# Patient Record
Sex: Male | Born: 1995 | Race: Black or African American | Hispanic: No | Marital: Single | Smoking: Current some day smoker
Health system: Southern US, Community
[De-identification: ages and names within clinical notes are randomized; demographics above are authoritative.]

---
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
## 2017-02-04 ENCOUNTER — Encounter (HOSPITAL_COMMUNITY): Payer: Self-pay

## 2017-02-04 ENCOUNTER — Emergency Department (HOSPITAL_COMMUNITY)
Admission: EM | Admit: 2017-02-04 | Discharge: 2017-02-04 | Disposition: A | Discharge status: Home / Self Care | Payer: Self-pay | Attending: Emergency Medicine | Admitting: Emergency Medicine

## 2017-02-04 DIAGNOSIS — R21 Rash and other nonspecific skin eruption: Secondary | ICD-10-CM | POA: Insufficient documentation

## 2017-02-04 DIAGNOSIS — F17200 Nicotine dependence, unspecified, uncomplicated: Secondary | ICD-10-CM | POA: Insufficient documentation

## 2017-02-04 MED ORDER — TRIAMCINOLONE ACETONIDE 0.1 % EX CREA: 1.0000 "application " | TOPICAL_CREAM | Freq: Two times a day (BID) | CUTANEOUS | 0 refills | Status: DC

## 2017-02-04 NOTE — Discharge Instructions (Signed)
Use Lamisil for one week - use as directed If rash has not gotten better after one week, fill steroid prescription and use twice daily for a week If rash is still not better after you have tried these two different treatments, make appointment with a primary care doctor or dermatologist Take Benadryl 25mg  as needed for itching which is over the counter

## 2017-02-04 NOTE — ED Provider Notes (Signed)
[facility]EMERGENCY DEPT Provider Note   CSN: [id number] Arrival date & time: 02/04/17  [id number]  By signing my name below, I, Tony Brooks, attest that this documentation has been prepared under the direction and in the presence of Terance HartKelly Oree Hislop, PA-C . Electronically Signed: Majel HomerPeyton Brooks, Scribe. 02/04/2017. 9:59 AM.  History   Chief Complaint Chief Complaint  Patient presents with  . Rash   The history is provided by the patient. No language interpreter was used.   HPI Comments: Tony Brooks is a 21 y.o. male who presents to the Emergency Department complaining of gradually worsening, pruritic, rash centralized to his left upper arm and upper right thigh that began ~2 days ago. Pt describes the rash on his left arm as "raised, small bumps that just got bigger over time." However, he describes the rash on his right thigh as "flat, burning, and more itchy." He notes his symptoms are exacerbated at night and states he has not applied any ointments or taken any medication to relieve his symptoms. Pt denies hx of any underlying skin disorders.   History reviewed. No pertinent past medical history.  There are no active problems to display for this patient.  History reviewed. No pertinent surgical history.  Home Medications    Prior to Admission medications   Not on File    Family History No family history on file.  Social History Social History  Substance Use Topics  . Smoking status: Current Some Day Smoker  . Smokeless tobacco: Never Used  . Alcohol use No   Allergies   Patient has no known allergies.  Review of Systems Review of Systems  Constitutional: Negative for fever.  Skin: Positive for rash.   Physical Exam Updated Vital Signs BP 117/73 (BP Location: Right Arm)   Pulse 82   Temp 98.5 F (36.9 C) (Oral)   Resp 16   Ht 5\' 10"  (1.778 m)   Wt 185 lb (83.9 kg)   SpO2 100%   BMI 26.54 kg/m   Physical Exam  Constitutional: He is oriented to person, place, and time.  He appears well-developed and well-nourished.  HENT:  Head: Normocephalic.  Eyes: EOM are normal.  Neck: Normal range of motion.  Pulmonary/Chest: Effort normal.  Abdominal: He exhibits no distension.  Musculoskeletal: Normal range of motion.  Neurological: He is alert and oriented to person, place, and time.  Skin: Rash noted.  Circular, scaly rash with central clearing on the left bicep with 2 papular nodules next to it. Flat, hyperpigmented, circular rash on the right upper thigh.   Psychiatric: He has a normal mood and affect.  Nursing note and vitals reviewed.  ED Treatments / Results  DIAGNOSTIC STUDIES:  Oxygen Saturation is 100% on RA, normal by my interpretation.    COORDINATION OF CARE:  9:46 AM Discussed treatment plan with pt at bedside and pt agreed to plan.  Labs (all labs ordered are listed, but only abnormal results are displayed) Labs Reviewed - No data to display  EKG  EKG Interpretation None      Radiology No results found.  Procedures Procedures (including critical care time)  Medications Ordered in ED Medications - No data to display  Initial Impression / Assessment and Plan / ED Course  I have reviewed the triage vital signs and the nursing notes.  Pertinent labs & imaging results that were available during my care of the patient were reviewed by me and considered in my medical decision making (see chart for details).  20  year old male with circular scaly rash. Possibly ringworm. Will tx with Lamisil for 1 week. Since pt does not have PCP, advised if not better in 1 week, fill fx for steroid and use that for an additional week. If still not better, make appointment with PCP or derm. He verbalized understanding. Return precautions given.  I personally performed the services described in this documentation, which was scribed in my presence. The recorded information has been reviewed and is accurate.   Final Clinical Impressions(s) / ED Diagnoses    Final diagnoses:  Rash    New Prescriptions New Prescriptions   No medications on file     Bethel Born, PA-C 02/05/17 4401    Tomasita Crumble, MD 02/05/17 1445

## 2018-05-08 ENCOUNTER — Encounter (HOSPITAL_COMMUNITY): Payer: Self-pay

## 2018-05-08 ENCOUNTER — Emergency Department (HOSPITAL_COMMUNITY): Payer: No Typology Code available for payment source

## 2018-05-08 ENCOUNTER — Emergency Department (HOSPITAL_COMMUNITY)
Admission: EM | Admit: 2018-05-08 | Discharge: 2018-05-08 | Disposition: A | Discharge status: Home / Self Care | Payer: No Typology Code available for payment source | Attending: Emergency Medicine | Admitting: Emergency Medicine

## 2018-05-08 ENCOUNTER — Other Ambulatory Visit: Payer: Self-pay

## 2018-05-08 DIAGNOSIS — S39012A Strain of muscle, fascia and tendon of lower back, initial encounter: Secondary | ICD-10-CM

## 2018-05-08 DIAGNOSIS — M7918 Myalgia, other site: Secondary | ICD-10-CM

## 2018-05-08 DIAGNOSIS — Y92410 Unspecified street and highway as the place of occurrence of the external cause: Secondary | ICD-10-CM | POA: Insufficient documentation

## 2018-05-08 DIAGNOSIS — F17210 Nicotine dependence, cigarettes, uncomplicated: Secondary | ICD-10-CM | POA: Diagnosis not present

## 2018-05-08 DIAGNOSIS — S3992XA Unspecified injury of lower back, initial encounter: Secondary | ICD-10-CM | POA: Diagnosis present

## 2018-05-08 DIAGNOSIS — M25562 Pain in left knee: Secondary | ICD-10-CM | POA: Diagnosis not present

## 2018-05-08 DIAGNOSIS — Y939 Activity, unspecified: Secondary | ICD-10-CM | POA: Insufficient documentation

## 2018-05-08 DIAGNOSIS — Y999 Unspecified external cause status: Secondary | ICD-10-CM | POA: Diagnosis not present

## 2018-05-08 DIAGNOSIS — M25522 Pain in left elbow: Secondary | ICD-10-CM | POA: Diagnosis not present

## 2018-05-08 MED ORDER — CYCLOBENZAPRINE HCL 10 MG PO TABS: 10.0000 mg | ORAL_TABLET | Freq: Two times a day (BID) | ORAL | 0 refills | Status: AC | PRN

## 2018-05-08 MED ORDER — MELOXICAM 7.5 MG PO TABS: 7.5000 mg | ORAL_TABLET | Freq: Every day | ORAL | 0 refills | Status: AC

## 2018-05-08 NOTE — Discharge Instructions (Signed)
Home to rest. Warm compresses to sore muscles for 20 minutes at a time. Take Flexeril as needed as prescribed for muscle spasm/tightness- do not take while driving or operating machinery. Take Meloxicam as needed for pain as prescribed. You can also take Tylenol for pain.  Follow up with your doctor, return to ER for worsening or concerning symptoms.
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
## 2020-03-19 ENCOUNTER — Emergency Department (HOSPITAL_COMMUNITY)
Admission: EM | Admit: 2020-03-19 | Discharge: 2020-03-19 | Disposition: A | Discharge status: Home / Self Care | Payer: No Typology Code available for payment source | Attending: Emergency Medicine | Admitting: Emergency Medicine

## 2020-03-19 ENCOUNTER — Other Ambulatory Visit: Payer: Self-pay

## 2020-03-19 ENCOUNTER — Emergency Department (HOSPITAL_COMMUNITY): Payer: No Typology Code available for payment source

## 2020-03-19 ENCOUNTER — Encounter (HOSPITAL_COMMUNITY): Payer: Self-pay | Admitting: Emergency Medicine

## 2020-03-19 DIAGNOSIS — M545 Low back pain: Secondary | ICD-10-CM | POA: Diagnosis not present

## 2020-03-19 DIAGNOSIS — F17210 Nicotine dependence, cigarettes, uncomplicated: Secondary | ICD-10-CM | POA: Diagnosis not present

## 2020-03-19 DIAGNOSIS — M25512 Pain in left shoulder: Secondary | ICD-10-CM | POA: Insufficient documentation

## 2020-03-19 MED ORDER — IBUPROFEN 400 MG PO TABS
600.0000 mg | ORAL_TABLET | Freq: Once | ORAL | Status: AC
  Administered 2020-03-19: 600 mg via ORAL
  Filled 2020-03-19: qty 1
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
## 2020-03-19 NOTE — ED Notes (Signed)
Patient to xray via wc with tech 

## 2020-03-19 NOTE — ED Notes (Signed)
Patient awake alert,color pink,chest clear,good aeration,no retractions 3 plus pulses<2sec refill,tolerated po med, discharged after avs reviewed
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
## 2020-03-19 NOTE — ED Notes (Signed)
ED Provider at bedside. 

## 2020-05-08 ENCOUNTER — Ambulatory Visit (HOSPITAL_COMMUNITY)
Admission: EM | Admit: 2020-05-08 | Discharge: 2020-05-08 | Disposition: A | Discharge status: Home / Self Care | Payer: Self-pay | Attending: Family Medicine | Admitting: Family Medicine

## 2020-05-08 ENCOUNTER — Encounter (HOSPITAL_COMMUNITY): Payer: Self-pay | Admitting: Emergency Medicine

## 2020-05-08 ENCOUNTER — Other Ambulatory Visit: Payer: Self-pay

## 2020-05-08 DIAGNOSIS — R519 Headache, unspecified: Secondary | ICD-10-CM

## 2020-05-08 DIAGNOSIS — I889 Nonspecific lymphadenitis, unspecified: Secondary | ICD-10-CM

## 2020-05-08 DIAGNOSIS — R220 Localized swelling, mass and lump, head: Secondary | ICD-10-CM

## 2020-05-08 DIAGNOSIS — M542 Cervicalgia: Secondary | ICD-10-CM

## 2020-05-08 MED ORDER — AMOXICILLIN-POT CLAVULANATE 875-125 MG PO TABS: 1.0000 | ORAL_TABLET | Freq: Two times a day (BID) | ORAL | 0 refills | Status: AC

## 2020-05-08 NOTE — Discharge Instructions (Addendum)
Infection of your lymph nodes  I have sent Augmentin in to your pharmacy. Take one tablet twice a day for 10 days. Take all of the medication.  Follow up with primary care or with this office if you are not feeling better over the next 2 days  Follow up with the ER with trouble swallowing, trouble breathing, other concerning symptoms

## 2020-05-08 NOTE — ED Provider Notes (Signed)
[facility]   [id number] 05/08/20 Arrival Time: 0958  CC: RASH  SUBJECTIVE:  Rutger Salton is a 24 y.o. male who presents with a skin complaint that began on 2 days ago. Denies precipitating event or trauma.  Denies changes in soaps, detergents, close contacts with similar rash, known trigger or environmental trigger, allergy. Denies medications change or starting a new medication recently. Localizes the rash to left cheek. Reports that he thought he had an ingrown hair at first, but he could not find one. Denies any dental pain. Describes it as tender and warm to touch. Report that the pain, swelling and tenderness has migrated to his left neck as well. Has tried ibuprofen without relief. Symptoms are made worse with activity and eating. Denies similar symptoms in the past. Denies fever, chills, nausea, vomiting, erythema, swelling, discharge, oral lesions, SOB, chest pain, abdominal pain, changes in bowel or bladder function.    ROS: As per HPI.  All other pertinent ROS negative.     History reviewed. No pertinent past medical history. History reviewed. No pertinent surgical history. No Known Allergies No current facility-administered medications on file prior to encounter.   Current Outpatient Medications on File Prior to Encounter  Medication Sig Dispense Refill  . cyclobenzaprine (FLEXERIL) 10 MG tablet Take 1 tablet (10 mg total) by mouth 2 (two) times daily as needed for muscle spasms. 20 tablet 0  . meloxicam (MOBIC) 7.5 MG tablet Take 1 tablet (7.5 mg total) by mouth daily. 20 tablet 0   Social History   Socioeconomic History  . Marital status: Single    Spouse name: Not on file  . Number of children: Not on file  . Years of education: Not on file  . Highest education level: Not on file  Occupational History  . Not on file  Tobacco Use  . Smoking status: Current Some Day Smoker    Types: Cigarettes  . Smokeless tobacco: Never Used  Vaping Use  . Vaping  Use: Never used  Substance and Sexual Activity  . Alcohol use: Yes    Comment: occasionally, not weekly  . Drug use: No  . Sexual activity: Not on file  Other Topics Concern  . Not on file  Social History Narrative  . Not on file   Social Determinants of Health   Financial Resource Strain:   . Difficulty of Paying Living Expenses:   Food Insecurity:   . Worried About Programme researcher, broadcasting/film/video in the Last Year:   . Barista in the Last Year:   Transportation Needs:   . Freight forwarder (Medical):   Marland Kitchen Lack of Transportation (Non-Medical):   Physical Activity:   . Days of Exercise per Week:   . Minutes of Exercise per Session:   Stress:   . Feeling of Stress :   Social Connections:   . Frequency of Communication with Friends and Family:   . Frequency of Social Gatherings with Friends and Family:   . Attends Religious Services:   . Active Member of Clubs or Organizations:   . Attends Banker Meetings:   Marland Kitchen Marital Status:   Intimate Partner Violence:   . Fear of Current or Ex-Partner:   . Emotionally Abused:   Marland Kitchen Physically Abused:   . Sexually Abused:    Family History  Problem Relation Age of Onset  . Healthy Mother   . Healthy Father     OBJECTIVE: Vitals:   05/08/20 1017  BP: Marland Kitchen)  112/58  Pulse: 85  Resp: 15  Temp: 99.2 F (37.3 C)  TempSrc: Oral  SpO2: 98%    General appearance: alert; no distress Head: NCAT Lungs: clear to auscultation bilaterally Heart: regular rate and rhythm.  Radial pulse 2+ bilaterally Extremities: no edema Skin: warm and dry; erythema, swelling and heat to left cheek, to left neck with lymphadenitis Psychological: alert and cooperative; normal mood and affect  ASSESSMENT & PLAN:  1. Facial swelling   2. Lymphadenitis   3. Neck pain   4. Facial pain     Meds ordered this encounter  Medications  . amoxicillin-clavulanate (AUGMENTIN) 875-125 MG tablet    Sig: Take 1 tablet by mouth 2 (two) times daily  for 10 days.    Dispense:  20 tablet    Refill:  0    Order Specific Question:   Supervising Provider    Answer:   Chase Picket [[id number]]    Facial swelling  Facial Pain Neck Pain Lymphadenitis  Prescribed Augmentin Take as prescribed and to completion Moisturize skin daily  Follow up with PCP if symptoms persists Return or go to the ER if you have any new or worsening symptoms such as fever, chills, nausea, vomiting, redness, swelling, discharge, if symptoms do not improve with medications  Reviewed expectations re: course of current medical issues. Questions answered. Outlined signs and symptoms indicating need for more acute intervention. Patient verbalized understanding. After Visit Summary given.   Faustino Congress, NP 05/08/20 1046

## 2020-05-08 NOTE — ED Triage Notes (Signed)
Pt c/o left sided facial swelling onset Sunday. Pt also has similar swelling in his right shoulder. Pt states the shoulder has been that way for a while now. Denies any problems with ROM in that arm. Facial swelling is effecting his chewing and causing pain.

## 2020-12-24 ENCOUNTER — Emergency Department (HOSPITAL_COMMUNITY)
Admission: EM | Admit: 2020-12-24 | Discharge: 2020-12-24 | Disposition: A | Discharge status: Home / Self Care | Payer: Self-pay | Attending: Emergency Medicine | Admitting: Emergency Medicine

## 2020-12-24 ENCOUNTER — Encounter (HOSPITAL_COMMUNITY): Payer: Self-pay | Admitting: Emergency Medicine

## 2020-12-24 ENCOUNTER — Emergency Department (HOSPITAL_COMMUNITY): Payer: Self-pay

## 2020-12-24 DIAGNOSIS — L0231 Cutaneous abscess of buttock: Secondary | ICD-10-CM | POA: Insufficient documentation

## 2020-12-24 DIAGNOSIS — M25411 Effusion, right shoulder: Secondary | ICD-10-CM | POA: Insufficient documentation

## 2020-12-24 DIAGNOSIS — F17210 Nicotine dependence, cigarettes, uncomplicated: Secondary | ICD-10-CM | POA: Insufficient documentation

## 2020-12-24 LAB — CBC WITH DIFFERENTIAL/PLATELET
Abs Immature Granulocytes: 0.03 10*3/uL (ref 0.00–0.07)
Basophils Absolute: 0 10*3/uL (ref 0.0–0.1)
Basophils Relative: 0 %
Eosinophils Absolute: 0.1 10*3/uL (ref 0.0–0.5)
Eosinophils Relative: 1 %
HCT: 37.8 % — ABNORMAL LOW (ref 39.0–52.0)
Hemoglobin: 12.7 g/dL — ABNORMAL LOW (ref 13.0–17.0)
Immature Granulocytes: 0 %
Lymphocytes Relative: 16 %
Lymphs Abs: 1.4 10*3/uL (ref 0.7–4.0)
MCH: 30.9 pg (ref 26.0–34.0)
MCHC: 33.6 g/dL (ref 30.0–36.0)
MCV: 92 fL (ref 80.0–100.0)
Monocytes Absolute: 0.8 10*3/uL (ref 0.1–1.0)
Monocytes Relative: 9 %
Neutro Abs: 6.4 10*3/uL (ref 1.7–7.7)
Neutrophils Relative %: 74 %
Platelets: 252 10*3/uL (ref 150–400)
RBC: 4.11 MIL/uL — ABNORMAL LOW (ref 4.22–5.81)
RDW: 12.8 % (ref 11.5–15.5)
WBC: 8.7 10*3/uL (ref 4.0–10.5)
nRBC: 0 % (ref 0.0–0.2)

## 2020-12-24 LAB — BASIC METABOLIC PANEL
Anion gap: 10 (ref 5–15)
BUN: 9 mg/dL (ref 6–20)
CO2: 25 mmol/L (ref 22–32)
Calcium: 9 mg/dL (ref 8.9–10.3)
Chloride: 103 mmol/L (ref 98–111)
Creatinine, Ser: 0.95 mg/dL (ref 0.61–1.24)
GFR, Estimated: >60 mL/min (ref >60)
Glucose, Bld: 88 mg/dL (ref 70–99)
Potassium: 4.2 mmol/L (ref 3.5–5.1)
Sodium: 138 mmol/L (ref 135–145)

## 2020-12-24 MED ORDER — DOXYCYCLINE HYCLATE 100 MG PO TABS
100.0000 mg | ORAL_TABLET | Freq: Once | ORAL | Status: AC
  Administered 2020-12-24: 100 mg via ORAL
  Filled 2020-12-24: qty 1

## 2020-12-24 MED ORDER — IBUPROFEN 400 MG PO TABS
600.0000 mg | ORAL_TABLET | Freq: Once | ORAL | Status: AC
  Administered 2020-12-24: 600 mg via ORAL
  Filled 2020-12-24: qty 1

## 2020-12-24 MED ORDER — IOHEXOL 350 MG/ML SOLN
100.0000 mL | Freq: Once | INTRAVENOUS | Status: AC | PRN
  Administered 2020-12-24: 100 mL via INTRAVENOUS

## 2020-12-24 MED ORDER — LIDOCAINE-EPINEPHRINE 1 %-1:100000 IJ SOLN
10.0000 mL | Freq: Once | INTRAMUSCULAR | Status: AC
  Administered 2020-12-24: 10 mL via INTRADERMAL
  Filled 2020-12-24: qty 1

## 2020-12-24 MED ORDER — DOXYCYCLINE HYCLATE 100 MG PO CAPS: 100.0000 mg | ORAL_CAPSULE | Freq: Two times a day (BID) | ORAL | 0 refills | Status: AC

## 2020-12-24 NOTE — ED Provider Notes (Signed)
[facility] EMERGENCY DEPARTMENT Provider Note   CSN: [id number] Arrival date & time: 12/24/20  1308     History Chief Complaint  Patient presents with  . Abscess    Tony Brooks is a 25 y.o. male who presents with 3 days of swelling and pain in his right buttock near his gluteal cleft.  Patient has history of skin infection last year on his face.  He did denies any recent changes in his soaps or personal hygiene products, denies wound to that area or trauma.He states he showers twice daily. Denies pain with bowel movements.   He endorses pain worsening when he sits on it, and gradual worsening of the swelling in the area. The "boil" has not drained, despite repeated application of hot compresses. He has not taken any OTC medications for his pain.  Denies fevers at home, but endorses chills.   Additionally, patient endorses swelling of the right shoulder, without associated pain, redness, or warmth.   I have personally reviewed this patient's medical records. He does not carry any medical diagnoses and is not on any medications daily.   HPI     History reviewed. No pertinent past medical history.  There are no problems to display for this patient.   No past surgical history on file.     Family History  Problem Relation Age of Onset  . Healthy Mother   . Healthy Father     Social History   Tobacco Use  . Smoking status: Current Some Day Smoker    Types: Cigarettes  . Smokeless tobacco: Never Used  Vaping Use  . Vaping Use: Never used  Substance Use Topics  . Alcohol use: Yes    Comment: occasionally, not weekly  . Drug use: No    Home Medications Prior to Admission medications   Medication Sig Start Date End Date Taking? Authorizing Provider  doxycycline (VIBRAMYCIN) 100 MG capsule Take 1 capsule (100 mg total) by mouth 2 (two) times daily for 10 days. 12/24/20 01/03/21 Yes Kaushal Vannice, Lupe Carney R, PA-C  cyclobenzaprine (FLEXERIL) 10 MG  tablet Take 1 tablet (10 mg total) by mouth 2 (two) times daily as needed for muscle spasms. 05/08/18   Jeannie Fend, PA-C  meloxicam (MOBIC) 7.5 MG tablet Take 1 tablet (7.5 mg total) by mouth daily. 05/08/18   Jeannie Fend, PA-C    Allergies    Patient has no known allergies.  Review of Systems   Review of Systems  Constitutional: Positive for chills. Negative for fatigue and fever.  HENT: Negative.   Eyes: Negative.   Respiratory: Negative.   Cardiovascular: Negative.   Gastrointestinal: Negative.   Genitourinary: Negative.   Musculoskeletal: Negative.   Skin: Positive for wound.  Neurological: Negative.   Hematological: Negative.   Psychiatric/Behavioral: Negative.     Physical Exam Updated Vital Signs BP 126/72 (BP Location: Left Arm)   Pulse 75   Temp 98.8 F (37.1 C) (Oral)   Resp 17   SpO2 100%   Physical Exam Vitals and nursing note reviewed. Exam conducted with a chaperone present.  Constitutional:      Appearance: Normal appearance.  HENT:     Head: Normocephalic and atraumatic.      Nose: Nose normal.     Mouth/Throat:     Mouth: Mucous membranes are moist.     Pharynx: Oropharynx is clear. Uvula midline. No oropharyngeal exudate, posterior oropharyngeal erythema or uvula swelling.     Tonsils: No tonsillar exudate.  Eyes:     General: Lids are normal. Vision grossly intact. No scleral icterus.       Right eye: No discharge.        Left eye: No discharge.     Extraocular Movements: Extraocular movements intact.     Conjunctiva/sclera: Conjunctivae normal.     Pupils: Pupils are equal, round, and reactive to light.  Neck:     Trachea: Trachea and phonation normal.  Cardiovascular:     Rate and Rhythm: Normal rate and regular rhythm.     Pulses: Normal pulses.     Heart sounds: Normal heart sounds. No murmur heard.   Pulmonary:     Effort: Pulmonary effort is normal. No respiratory distress.     Breath sounds: Normal breath sounds. No  wheezing or rales.  Chest:     Chest wall: No swelling, tenderness, crepitus or edema.  Abdominal:     General: Bowel sounds are normal. There is no distension.     Palpations: Abdomen is soft.     Tenderness: There is no abdominal tenderness. There is no guarding or rebound.  Genitourinary:   Musculoskeletal:        General: No deformity.     Cervical back: Normal range of motion and neck supple. No rigidity, tenderness or crepitus. No pain with movement, spinous process tenderness or muscular tenderness.     Right lower leg: No edema.     Left lower leg: No edema.  Lymphadenopathy:     Cervical: No cervical adenopathy.  Skin:    General: Skin is warm and dry.     Capillary Refill: Capillary refill takes less than 2 seconds.     Findings: Abscess present.  Neurological:     General: No focal deficit present.     Mental Status: He is alert and oriented to person, place, and time. Mental status is at baseline.  Psychiatric:        Mood and Affect: Mood normal.     ED Results / Procedures / Treatments   Labs (all labs ordered are listed, but only abnormal results are displayed) Labs Reviewed  CBC WITH DIFFERENTIAL/PLATELET - Abnormal; Notable for the following components:      Result Value   RBC 4.11 (*)    Hemoglobin 12.7 (*)    HCT 37.8 (*)    All other components within normal limits  BASIC METABOLIC PANEL    EKG None  Radiology DG Shoulder Right  Result Date: 12/24/2020 CLINICAL DATA:  Right shoulder mass EXAM: RIGHT SHOULDER - 2+ VIEW COMPARISON:  None. FINDINGS: There is no evidence of fracture or dislocation. There is no evidence of arthropathy or other focal bone abnormality. Soft tissues are unremarkable. IMPRESSION: Negative. Electronically Signed   By: Helyn Numbers MD   On: 12/24/2020 12:51   CT Abdomen Pelvis W Contrast  Result Date: 12/24/2020 CLINICAL DATA:  Buttock/perianal abscess. EXAM: CT ABDOMEN AND PELVIS WITH CONTRAST TECHNIQUE: Multidetector CT  imaging of the abdomen and pelvis was performed using the standard protocol following bolus administration of intravenous contrast. CONTRAST:  OMNIPAQUE IOHEXOL 350 MG/ML SOLN COMPARISON:  None. FINDINGS: Lower chest: Unremarkable Hepatobiliary: Unremarkable Pancreas: Unremarkable Spleen: Unremarkable Adrenals/Urinary Tract: Unremarkable Stomach/Bowel: Unremarkable Vascular/Lymphatic: Right external iliac node 1.0 cm short axis, image 68 series 3. Reproductive: Unremarkable Other: In the subcutaneous tissues of the right buttock medial to the gluteal fold and along the gluteal cleft, a 3.1 by 1.4 by 2.5 cm hypodense lesion is present suspicious for  a small subcutaneous abscess. There is adjacent stranding in the buttock not extending substantially anteriorly along the perineum or scrotum. Musculoskeletal: Unremarkable IMPRESSION: 1. In the subcutaneous tissues of the right buttock medial to the gluteal fold and along the gluteal cleft, a 3.1 by 1.4 by 2.5 cm hypodense lesion is present suspicious for a small subcutaneous abscess. There is adjacent stranding in the buttock not extending substantially anteriorly along the perineum or scrotum. No abnormal gas is identified tracking in the soft tissues or along regional musculature. 2. Borderline enlarged right external iliac node, likely reactive. Electronically Signed   By: Gaylyn Rong M.D.   On: 12/24/2020 14:25    Procedures .Marland KitchenIncision and Drainage  Date/Time: 12/24/2020 3:39 PM Performed by: Paris Lore, PA-C Authorized by: Paris Lore, PA-C   Consent:    Consent obtained:  Verbal   Consent given by:  Patient   Risks, benefits, and alternatives were discussed: yes     Risks discussed:  Bleeding, incomplete drainage, pain and damage to other organs   Alternatives discussed:  No treatment Universal protocol:    Procedure explained and questions answered to patient or proxy's satisfaction: yes     Relevant documents  present and verified: yes     Test results available : yes     Imaging studies available: yes     Required blood products, implants, devices, and special equipment available: yes     Site/side marked: yes     Immediately prior to procedure, a time out was called: yes     Patient identity confirmed:  Verbally with patient Location:    Type:  Abscess   Size:  3x4 cm   Location:  Lower extremity   Lower extremity location:  Buttock   Buttock location:  R buttock Pre-procedure details:    Skin preparation:  Chlorhexidine with alcohol Sedation:    Sedation type:  None Anesthesia:    Anesthesia method:  Local infiltration   Local anesthetic:  Lidocaine 1% WITH epi Procedure type:    Complexity:  Simple Procedure details:    Ultrasound guidance: no     Needle aspiration: no     Incision types:  Single straight   Incision depth:  Subcutaneous   Scalpel blade:  11   Wound management:  Probed and deloculated, irrigated with saline and extensive cleaning   Drainage:  Purulent and bloody   Drainage amount:  Moderate   Wound treatment:  Wound left open   Packing materials:  None Post-procedure details:    Procedure completion:  Tolerated well, no immediate complications     Medications Ordered in ED Medications  ibuprofen (ADVIL) tablet 600 mg (600 mg Oral Given 12/24/20 1201)  iohexol (OMNIPAQUE) 350 MG/ML injection 100 mL (100 mLs Intravenous Contrast Given 12/24/20 1347)  lidocaine-EPINEPHrine (XYLOCAINE W/EPI) 1 %-1:100000 (with pres) injection 10 mL (10 mLs Intradermal Given 12/24/20 1508)  doxycycline (VIBRA-TABS) tablet 100 mg (100 mg Oral Given 12/24/20 1543)    ED Course  I have reviewed the triage vital signs and the nursing notes.  Pertinent labs & imaging results that were available during my care of the patient were reviewed by me and considered in my medical decision making (see chart for details).    MDM Rules/Calculators/A&P                         24-year male  presents with concern for pain and swelling to his right buttock x3 days.  Vital signs are normal on intake.  Physical exam is reassuring.  Cardiopulmonary exam, abdominal exam is benign.  There is what appears to be an abscess of the right buttock at the gluteal cleft.  Concern for extension adjacent to rectum.  Will proceed with basic laboratory studies and CT of the abdomen pelvis to further evaluate.  Analgesia offered. Incidentally mild swelling over the right shoulder without TTP, erythema, or fluctuance. Will proceed with plain film.  CBC without leukocytosis, mild anemia with hemoglobin 12.7.  BMP unremarkable, CT of the abdomen pelvis revealed subcutaneous abscess 3 x 1 x 2.5 cm in the right buttock without extension on perineum, without tracking through soft tissues toward the rectum.  Abscesses amenable to drainage in the emergency department at this time.  Abscess drained per procedure note above.  Patient tolerated procedure well, and had significant relief after the procedure.  Given reassuring physical exam, vital signs, laboratory and imaging studies, no further work-up is warranted in the ED at this time.  First dose of antibiotics administered in the emergency department, will discharge with course of antibiotics for soft tissue infection.  Patient may utilize OTC analgesia as needed.  Mikal voiced understanding of his medical evaluation and treatment plan.  Each of his questions was answered to his expressed satisfaction.  Return precautions given.  Patient is well-appearing, stable, and appropriate for discharge at this time.  This chart was dictated using voice recognition software, Dragon. Despite the best efforts of this provider to proofread and correct errors, errors may still occur which can change documentation meaning.  Final Clinical Impression(s) / ED Diagnoses Final diagnoses:  Abscess of buttock, right    Rx / DC Orders ED Discharge Orders         Ordered     doxycycline (VIBRAMYCIN) 100 MG capsule  2 times daily        12/24/20 [address], [place], PA-C 12/24/20 1708    Gerhard Munch, MD 12/30/20 1114

## 2020-12-24 NOTE — Discharge Instructions (Addendum)
You were evaluated in the emergency room today for your infection on the right buttock.  Your blood work and CT scan were very reassuring. There was an abscess, which is a collection of infected fluid, in your right buttock. Fortunately, there is no involvement of your rectum/ intestine or your genitals and your infection on your CT scan.  A hole was created in your abscess to allow the infected fluid to drain. Please replace this dressing at least once or twice daily, or anytime the dressing gets wet or soiled. You can expect there to be blood and infected fluid to continue to drain from the area. You have been given your first dose antibiotic in the emergency department, and have been prescribed an antibiotic to take at home. Please take your antibiotic as prescribed for the entire course. You may take Tylenol or ibuprofen as needed. Additionally should continue to apply warm compresses to the area, or sit in a bathtub with a few inches of warm water.  Return to the emergency department if you develop fevers, chills, worsening swelling or pain in the right buttock, nausea, vomiting, or any other new severe symptoms.

## 2020-12-24 NOTE — ED Triage Notes (Signed)
Pt arrives to ED with chief complaint of boil near hs rectum for 2-3 days. Pt also have bump on left side of jaw and his has caused a patch of hair to fall out.

## 2020-12-24 NOTE — ED Notes (Signed)
Patient transported to CT 

## 2020-12-24 NOTE — ED Notes (Signed)
Patient returned from CT

## 2021-09-15 ENCOUNTER — Other Ambulatory Visit: Payer: Self-pay

## 2021-09-15 ENCOUNTER — Encounter (HOSPITAL_COMMUNITY): Payer: Self-pay | Admitting: *Deleted

## 2021-09-15 ENCOUNTER — Emergency Department (HOSPITAL_COMMUNITY): Payer: Self-pay

## 2021-09-15 ENCOUNTER — Emergency Department (HOSPITAL_COMMUNITY)
Admission: EM | Admit: 2021-09-15 | Discharge: 2021-09-15 | Disposition: A | Discharge status: Home / Self Care | Payer: Self-pay | Attending: Emergency Medicine | Admitting: Emergency Medicine

## 2021-09-15 DIAGNOSIS — L0291 Cutaneous abscess, unspecified: Secondary | ICD-10-CM

## 2021-09-15 DIAGNOSIS — N492 Inflammatory disorders of scrotum: Secondary | ICD-10-CM | POA: Insufficient documentation

## 2021-09-15 DIAGNOSIS — F17210 Nicotine dependence, cigarettes, uncomplicated: Secondary | ICD-10-CM | POA: Insufficient documentation

## 2021-09-15 LAB — URINALYSIS, COMPLETE (UACMP) WITH MICROSCOPIC
Bacteria, UA: NONE SEEN
Bilirubin Urine: NEGATIVE
Glucose, UA: NEGATIVE mg/dL
Hgb urine dipstick: NEGATIVE
Ketones, ur: NEGATIVE mg/dL
Leukocytes,Ua: NEGATIVE
Nitrite: NEGATIVE
Protein, ur: NEGATIVE mg/dL
Specific Gravity, Urine: 1.014 (ref 1.005–1.030)
pH: 5 (ref 5.0–8.0)

## 2021-09-15 LAB — HIV ANTIBODY (ROUTINE TESTING W REFLEX): HIV Screen 4th Generation wRfx: NONREACTIVE

## 2021-09-15 MED ORDER — OXYCODONE-ACETAMINOPHEN 5-325 MG PO TABS
2.0000 | ORAL_TABLET | Freq: Once | ORAL | Status: AC
  Administered 2021-09-15: 2 via ORAL
  Filled 2021-09-15: qty 2

## 2021-09-15 MED ORDER — AMOXICILLIN-POT CLAVULANATE 875-125 MG PO TABS: 1.0000 | ORAL_TABLET | Freq: Two times a day (BID) | ORAL | 0 refills | Status: AC

## 2021-09-15 MED ORDER — FENTANYL CITRATE PF 50 MCG/ML IJ SOSY
50.0000 ug | PREFILLED_SYRINGE | Freq: Once | INTRAMUSCULAR | Status: AC
  Administered 2021-09-15: 50 ug via INTRAVENOUS
  Filled 2021-09-15: qty 1

## 2021-09-15 MED ORDER — LIDOCAINE HCL (PF) 1 % IJ SOLN
5.0000 mL | Freq: Once | INTRAMUSCULAR | Status: AC
  Administered 2021-09-15: 5 mL via INTRADERMAL
  Filled 2021-09-15: qty 5

## 2021-09-15 NOTE — ED Triage Notes (Signed)
C/o abscess on buttocks onset 2-3 days ago.

## 2021-09-15 NOTE — ED Provider Notes (Signed)
Emergency Medicine Provider Triage Evaluation Note  Tony Brooks , a 25 y.o. male  was evaluated in triage.  Pt complains of abscess on buttocks x3 days. 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 Has been trying warm rags, hot showers, and over-the-counter medications without relief.  Review of Systems  Positive: Abscess Negative: Fever, chills, nausea, vomiting, constipation, diarrhea, dysuria  Physical Exam  BP 137/73 (BP Location: Right Arm)   Pulse 93   Temp 99 F (37.2 C)   Resp 16   Ht 5\' 10"  (1.778 m)   Wt 83.9 kg   SpO2 100%   BMI 26.54 kg/m  Gen:   Awake, no distress   Resp:  Normal effort  MSK:   Moves extremities without difficulty  Other:    Medical Decision Making  Medically screening exam initiated at 12:08 PM.  Appropriate orders placed.  Alyus Mofield was informed that the remainder of the evaluation will be completed by another provider, this initial triage assessment does not replace that evaluation, and the importance of remaining in the ED until their evaluation is complete.     Avina Eberle T, PA-C 09/15/21 1210    09/17/21, DO 09/16/21 13/01/22

## 2021-09-15 NOTE — Discharge Instructions (Addendum)
Dear Tony Brooks,  Thank you for allowing [place] to take care of you today.  We hope you begin feeling better soon.  - Please follow-up with your primary care physician or schedule an appointment to establish a primary care doctor if you do not have one already. It is important that you review any labs or imaging results (if any) obtained today with them. The preliminary imaging results and diagnosis (if any) are attached. - Please return to the Emergency Department or call 911 for chest pain, shortness of breath, severe pain, altered mental status, or if you have any reason to think you may need emergency medical care. -Call the number above to establish a PCP. -Follow-up with PCP in the next week for wound recheck -Take antibiotics until complete, even if you are feeling better -May use ibuprofen or Tylenol as directed as needed every 6-8 hours for pain -May use ice to the area as needed.  Do not place directly on skin. -No submersion of wound for the next 48 hours, such as baths, hot tubs, or pools.  Keep wound clean and dry, may wash gently with soap and water in the shower.  Apply a fresh dressing as instructed after gentle washing. -Monitor area closely for signs of infection.  If you start to notice increasing redness, pain, fevers, or red streaking from the wound, present to the ED right away for further care. -Your GC/chlamydia and syphilis tests are pending.  You will receive a phone call if these are positive and you need to be started on a specific antibiotic.   Sincerely,  Dwaine Gale, DO Department of Emergency Medicine [place]   Abscess

## 2021-09-15 NOTE — ED Provider Notes (Addendum)
[facility] EMERGENCY DEPARTMENT Provider Note   CSN: [id number] Arrival date & time: 09/15/21  1151     History Chief Complaint  Patient presents with   Abscess    Tony Brooks is a 25 y.o. male.   Abscess Associated symptoms: no fever, no headaches, no nausea and no vomiting    24 year old otherwise healthy male who presents to the ED with complaints of "buttock swelling."  Patient reports that for the past 2 days, he has had swelling of his medial buttock area, but has been unable to visualize the specific location.  It has been tender to palpation but has not been draining or bleeding.  While in the ED, he noticed a sensation of drainage from the area.  He denies any systemic symptoms, such as fevers or chills, rashes, chest pain or trouble breathing, abdominal pain, nausea or vomiting, decreased p.o. intake, hematuria or dysuria, penile discharge or bleeding, testicular pain, or any other concerns.  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  He denies any immunocompromising conditions.  He does not know his last tetanus.  History reviewed. No pertinent past medical history.  There are no problems to display for this patient.   History reviewed. No pertinent surgical history.     Family History  Problem Relation Age of Onset   Healthy Mother    Healthy Father     Social History   Tobacco Use   Smoking status: Some Days    Types: Cigarettes   Smokeless tobacco: Never  Vaping Use   Vaping Use: Never used  Substance Use Topics   Alcohol use: Yes    Comment: occasionally, not weekly   Drug use: Yes    Types: Marijuana    Home Medications Prior to Admission medications   Medication Sig Start Date End Date Taking? Authorizing Provider  amoxicillin-clavulanate (AUGMENTIN) 875-125 MG tablet Take 1 tablet by mouth every 12 (twelve) hours for 5 days. 09/15/21 09/20/21 Yes Clarance Bollard, Syed Zukas, DO  cyclobenzaprine  (FLEXERIL) 10 MG tablet Take 1 tablet (10 mg total) by mouth 2 (two) times daily as needed for muscle spasms. 05/08/18   Tacy Learn, PA-C  meloxicam (MOBIC) 7.5 MG tablet Take 1 tablet (7.5 mg total) by mouth daily. 05/08/18   Tacy Learn, PA-C    Allergies    Patient has no known allergies.  Review of Systems   Review of Systems  Constitutional:  Negative for activity change, appetite change, chills and fever.  HENT:  Negative for ear pain and sore throat.   Eyes:  Negative for pain and visual disturbance.  Respiratory:  Negative for cough and shortness of breath.   Cardiovascular:  Negative for chest pain and palpitations.  Gastrointestinal:  Negative for abdominal distention, abdominal pain, blood in stool, constipation, diarrhea, nausea, rectal pain and vomiting.  Genitourinary:  Negative for decreased urine volume, difficulty urinating, dysuria, frequency, hematuria, penile discharge, penile pain, penile swelling, scrotal swelling, testicular pain and urgency.  Musculoskeletal:  Negative for arthralgias and back pain.  Skin:  Negative for color change, pallor and rash.  Allergic/Immunologic: Negative for immunocompromised state.  Neurological:  Negative for dizziness, seizures, syncope, light-headedness, numbness and headaches.  Hematological:  Negative for adenopathy. Does not bruise/bleed easily.  Psychiatric/Behavioral:  Negative for confusion. The patient is not nervous/anxious.   All other systems reviewed and are negative.  Physical Exam Updated Vital Signs BP 101/89 (BP Location: Left Arm)   Pulse 61   Temp Marland Kitchen)  97.3 F (36.3 C) (Oral)   Resp 18   Ht 5\' 10"  (1.778 m)   Wt 83.9 kg   SpO2 99%   BMI 26.54 kg/m   Physical Exam Vitals and nursing note reviewed. Exam conducted with a chaperone present.  Constitutional:      General: He is not in acute distress.    Appearance: Normal appearance. He is well-developed and normal weight. He is not ill-appearing or  toxic-appearing.  HENT:     Head: Normocephalic and atraumatic.     Right Ear: External ear normal.     Left Ear: External ear normal.     Nose: Nose normal.     Mouth/Throat:     Mouth: Mucous membranes are moist.     Pharynx: Oropharynx is clear.  Eyes:     General: No scleral icterus.    Extraocular Movements: Extraocular movements intact.     Conjunctiva/sclera: Conjunctivae normal.     Pupils: Pupils are equal, round, and reactive to light.  Cardiovascular:     Rate and Rhythm: Normal rate and regular rhythm.     Pulses: Normal pulses.     Heart sounds: No murmur heard. Pulmonary:     Effort: Pulmonary effort is normal. No respiratory distress.     Breath sounds: Normal breath sounds.  Abdominal:     Palpations: Abdomen is soft.     Tenderness: There is no abdominal tenderness.  Genitourinary:    Penis: Normal.      Testes: Normal. Cremasteric reflex is present.        Right: Mass or tenderness not present.        Left: Mass or tenderness not present.     Epididymis:     Right: Normal.     Left: Normal.     Comments: 3 cm indurated mass on the inferior posterior scrotum/perineum, midline.  Exquisitely TTP with purulence and surrounding erythema. Actively draining scant purulent discharge. Musculoskeletal:        General: Normal range of motion.     Cervical back: Normal range of motion and neck supple. No rigidity or tenderness.     Right lower leg: No edema.     Left lower leg: No edema.  Lymphadenopathy:     Cervical: No cervical adenopathy.     Lower Body: No right inguinal adenopathy. No left inguinal adenopathy.  Skin:    General: Skin is warm and dry.     Capillary Refill: Capillary refill takes less than 2 seconds.  Neurological:     General: No focal deficit present.     Mental Status: He is alert and oriented to person, place, and time. Mental status is at baseline.  Psychiatric:        Mood and Affect: Mood normal.        Behavior: Behavior normal.     ED Results / Procedures / Treatments   Labs (all labs ordered are listed, but only abnormal results are displayed) Labs Reviewed  URINALYSIS, COMPLETE (UACMP) WITH MICROSCOPIC  HIV ANTIBODY (ROUTINE TESTING W REFLEX)  RPR  GC/CHLAMYDIA PROBE AMP ([place]) NOT AT [facility]    EKG None  Radiology US Scrotum  Result Date: 09/15/2021 CLINICAL DATA:  Scrotal abscess. EXAM: ULTRASOUND OF SCROTUM TECHNIQUE: Complete ultrasound examination of the testicles, epididymis, and other scrotal structures was performed. COMPARISON:  None. FINDINGS: Right testicle Measurements: 4.6 x 1.7 x 2.5 cm. Homogeneous echogenicity. No mass or microlithiasis visualized. No testicular hyperemia. Extratesticular focus of fluid measuring  1.2 x 0.3 x 0.6 cm is extratesticular, and may represent a mildly loculated hydrocele. This does not have internal complexity, hyperemia or vascularity to suggest infection. Left testicle Measurements: 4.5 x 1.8 x 3.1 cm. Homogeneous echogenicity. No testicular hyperemia. No mass or microlithiasis visualized. Right epididymis: Epididymal head cyst measures 6 mm. Otherwise normal in appearance. No epididymal hyperemia. Left epididymis: Tiny epididymal head cysts measuring 3 mm. Otherwise normal in appearance. No epididymal hyperemia. Hydrocele:  None visualized. Varicocele:  None visualized. Other: The perineum was imaged in the area of clinical concern. Heterogeneous hypervascular edematous area of skin measuring approximately 4.3 x 1.5 x 2.1 cm. There is some ill-defined fluid with a tract extending to skin. Fluid component is approximately 15 mm with ill-defined borders. IMPRESSION: 1. No evidence of intrascrotal collection. 2. Heterogeneous hypervascular edematous area in the perineum in area of clinical concern with ill-defined fluid measuring approximately 15 mm and tract to skin, suspicious for perineal abscess. This does not extend into the scrotum. Electronically Signed   By:  Keith Rake M.D.   On: 09/15/2021 19:05    Procedures .Marland KitchenIncision and Drainage  Date/Time: 09/15/2021 11:10 PM Performed by: Cherly Hensen, DO Authorized by: Lucrezia Starch, MD   Consent:    Consent obtained:  Verbal   Consent given by:  Patient   Risks, benefits, and alternatives were discussed: yes     Risks discussed:  Bleeding, incomplete drainage, infection, damage to other organs and pain   Alternatives discussed:  No treatment, alternative treatment and referral Universal protocol:    Procedure explained and questions answered to patient or proxy's satisfaction: yes     Relevant documents present and verified: yes     Test results available : yes     Imaging studies available: yes     Required blood products, implants, devices, and special equipment available: yes     Immediately prior to procedure, a time out was called: yes     Patient identity confirmed:  Verbally with patient and arm band Location:    Type:  Abscess   Size:  15 mm   Location:  Anogenital   Anogenital location:  Perineum Pre-procedure details:    Skin preparation:  Povidone-iodine Sedation:    Sedation type:  None Anesthesia:    Anesthesia method:  Local infiltration   Local anesthetic:  Lidocaine 1% w/o epi Procedure type:    Complexity:  Simple Procedure details:    Ultrasound guidance: no     Needle aspiration: no     Incision types:  Single straight   Incision depth:  Dermal   Wound management:  Probed and deloculated   Drainage:  Bloody   Drainage amount:  Scant   Wound treatment:  Wound left open   Packing materials:  None Post-procedure details:    Procedure completion:  Tolerated well, no immediate complications   Medications Ordered in ED Medications  oxyCODONE-acetaminophen (PERCOCET/ROXICET) 5-325 MG per tablet 2 tablet (2 tablets Oral Given 09/15/21 1825)  lidocaine (PF) (XYLOCAINE) 1 % injection 5 mL (5 mLs Intradermal Given 09/15/21 2218)  fentaNYL (SUBLIMAZE)  injection 50 mcg (50 mcg Intravenous Given 09/15/21 2220)    ED Course  I have reviewed the triage vital signs and the nursing notes.  Pertinent labs & imaging results that were available during my care of the patient were reviewed by me and considered in my medical decision making (see chart for details).    MDM Rules/Calculators/A&P  Tony Brooks is a 25 y.o. male presenting with abscess. Initial VS wnl.  Labs: UA WNL.  HIV negative.  GC/chlamydia and RPR pending.   Imaging: 15 mm perineal abscess noted on scrotal ultrasound. Imaging was reviewed by radiology and personally by me.  DDX considered: Cellulitis, sepsis, testicular torsion, fistula, epididymitis, STI, UTI, testicular mass. History, examination, and objective data most consistent with perineal abscess.  No systemic infectious signs or symptoms, and patient is otherwise well-appearing.  No signs of torsion or epididymitis on ultrasound.  STI test pending, no penile discharge or other GU symptoms.  Low suspicion for neoplastic process given short duration of symptoms and history of prior abscesses.   Medications: Medications  oxyCODONE-acetaminophen (PERCOCET/ROXICET) 5-325 MG per tablet 2 tablet (2 tablets Oral Given 09/15/21 1825)  lidocaine (PF) (XYLOCAINE) 1 % injection 5 mL (5 mLs Intradermal Given 09/15/21 2218)  fentaNYL (SUBLIMAZE) injection 50 mcg (50 mcg Intravenous Given 09/15/21 2220)    I&D performed successfully as above.  Dressed with nonadherent dressing.  Re-evaluated prior to discharge. Hemodynamically stable and in no acute distress.  Patient well-appearing, ambulates without difficulty.  Pain well controlled.  Discharged home in stable condition. Strict ED return precautions advised. Supportive care and wound care discussed.  Outpt f/u on pending labs discussed with pt. Dressing supplies given at discharge.  Rx for Augmentin x5 days.  Outpatient PCP follow-up advised, number  given to establish PCP.  Patient understands and agrees with the plan.  The plan for this patient was discussed with my attending physician, who voiced agreement and who oversaw evaluation and treatment of this patient.     Note: Chief Executive Officer was used in the creation of this note.  Final Clinical Impression(s) / ED Diagnoses Final diagnoses:  Abscess    Rx / DC Orders ED Discharge Orders          Ordered    amoxicillin-clavulanate (AUGMENTIN) 875-125 MG tablet  Every 12 hours        09/15/21 2233             Dwaine Gale, DO 09/15/21 2313    Dwaine Gale, DO 09/15/21 2314    Milagros Loll, MD 09/16/21 2351

## 2021-09-16 LAB — RPR: RPR Ser Ql: NONREACTIVE

## 2021-12-30 IMAGING — CT CT ABD-PELV W/ CM
2 of 5 series · 16 of 46 positions shown, 18 images · IV contrast (Omni 300)
Comparison: None.

CLINICAL DATA: Buttock/perianal abscess.

EXAM:
CT ABDOMEN AND PELVIS WITH CONTRAST
TECHNIQUE: Multidetector CT imaging of the abdomen and pelvis was performed
using the standard protocol following bolus administration of
intravenous contrast.
CONTRAST:  100mL OMNIPAQUE IOHEXOL 350 MG/ML SOLN

[Series 3: a/p w/ 5mm · axial · 0.77mm/px · z∈[+725,+1130]mm · 13 of 91 slices shown, 15 images]
[im 5/91  soft-tissue]
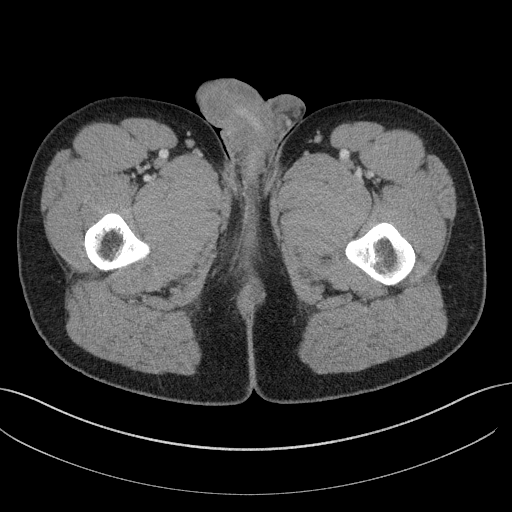
[im 5/91  bone]
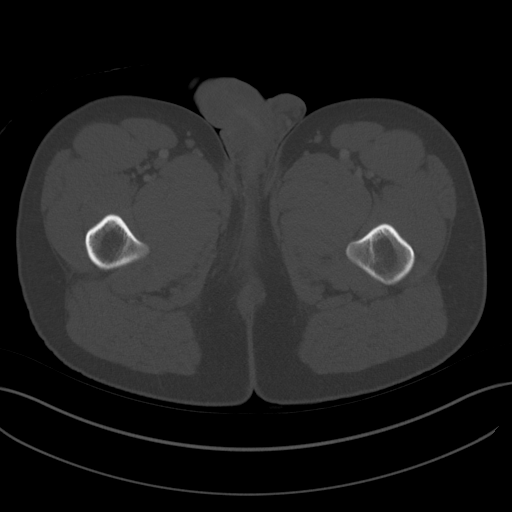
[im 14/91  soft-tissue]
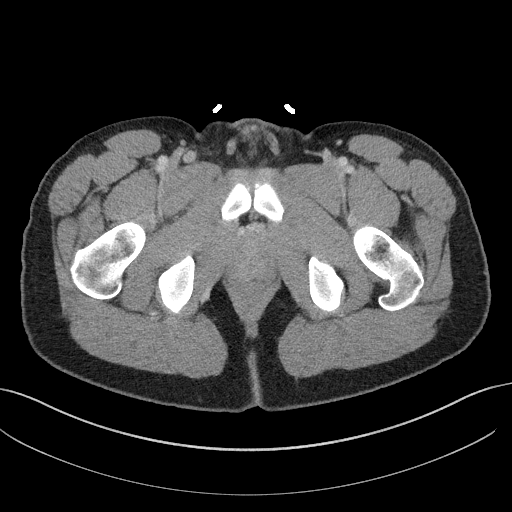
[im 19/91  soft-tissue]
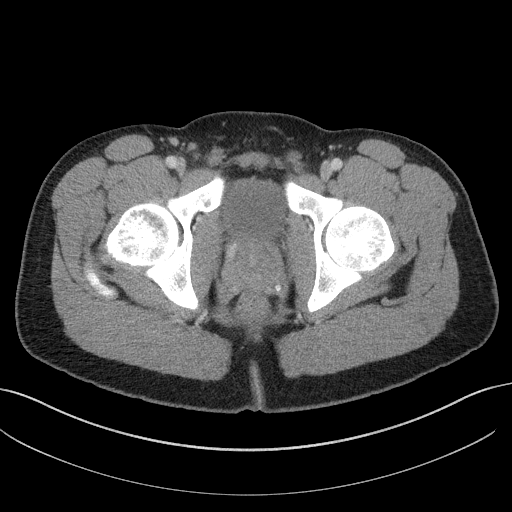
[im 28/91  soft-tissue]
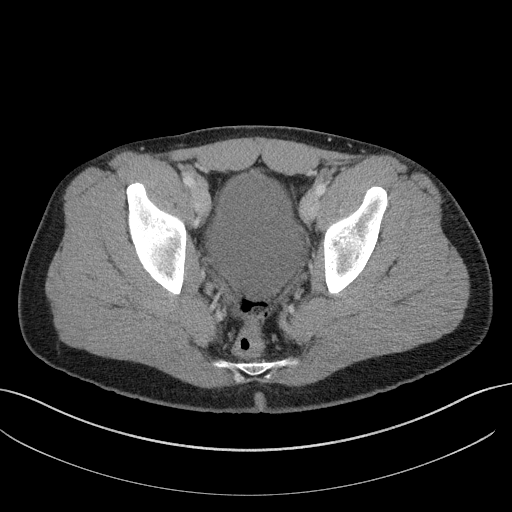
[im 32/91  soft-tissue]
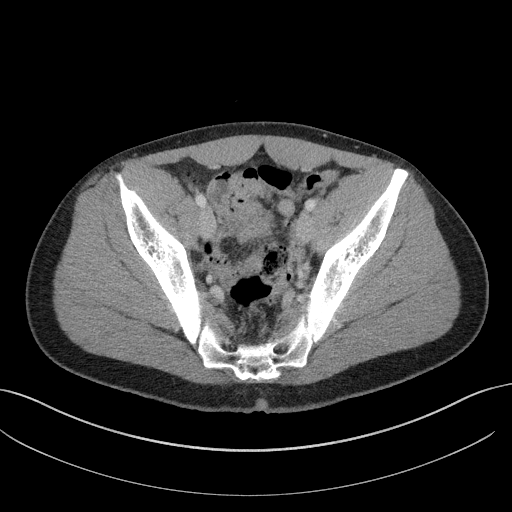
[im 41/91  soft-tissue]
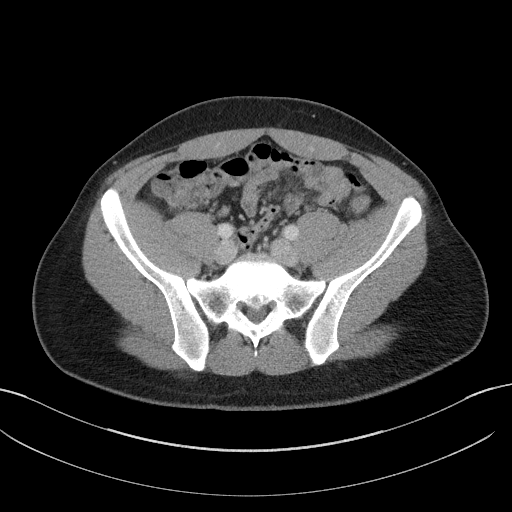
[im 46/91  soft-tissue]
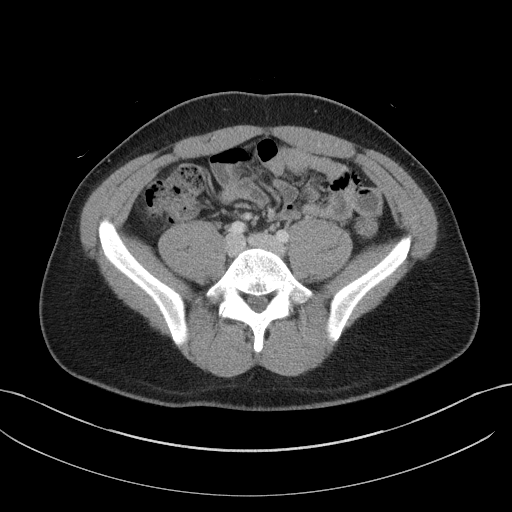
[im 50/91  soft-tissue]
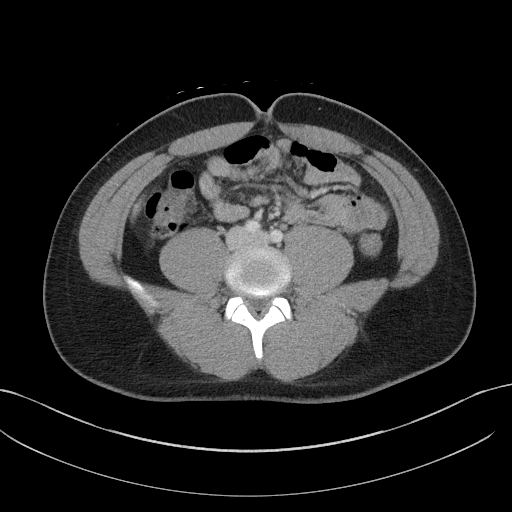
[im 59/91  soft-tissue]
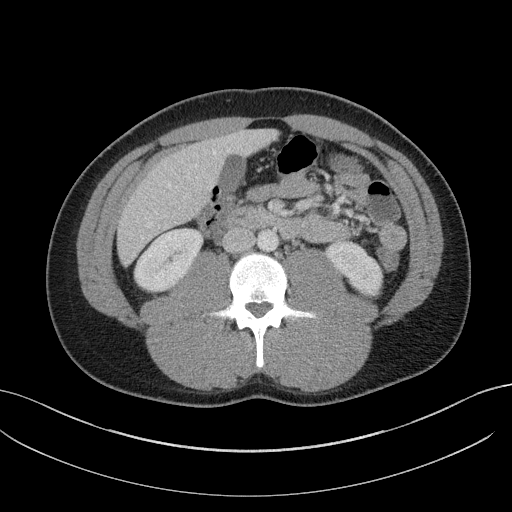
[im 59/91  bone]
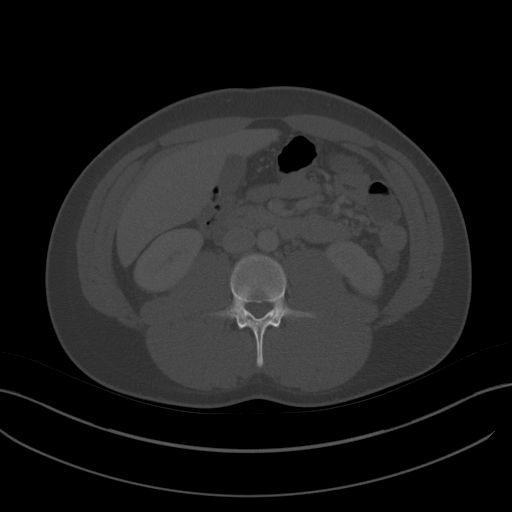
[im 64/91  soft-tissue]
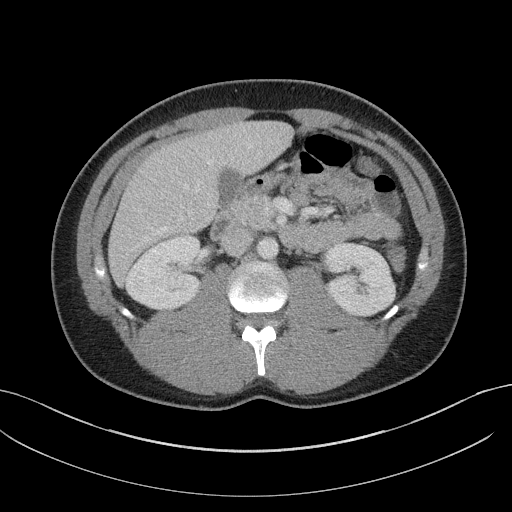
[im 73/91  soft-tissue]
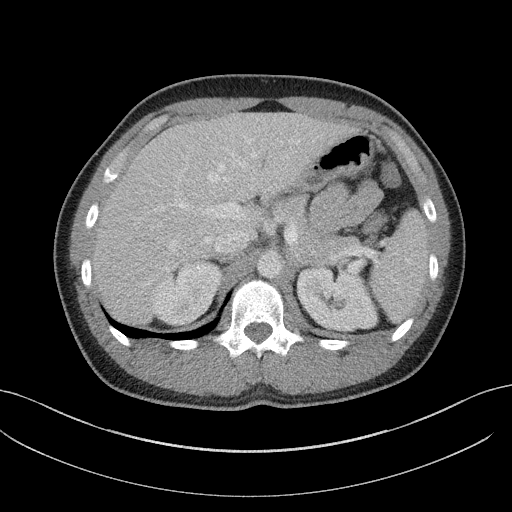
[im 77/91  soft-tissue]
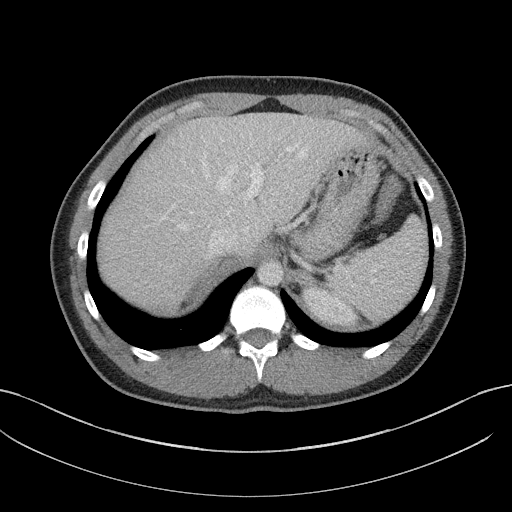
[im 86/91  soft-tissue]
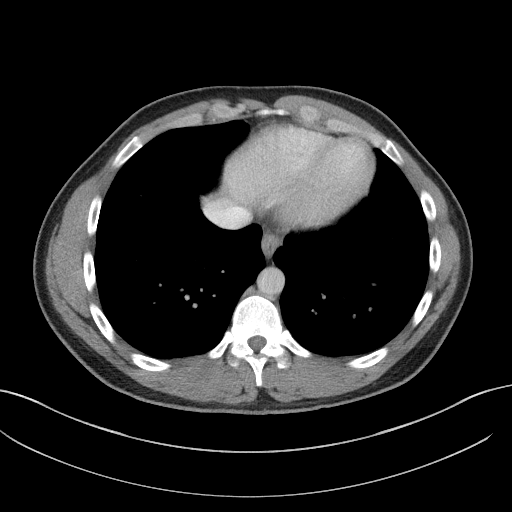

[Series 8: a/p w/ cor · coronal · 0.77mm/px · 3 of 151 slices shown]
[im 51/151  soft-tissue]
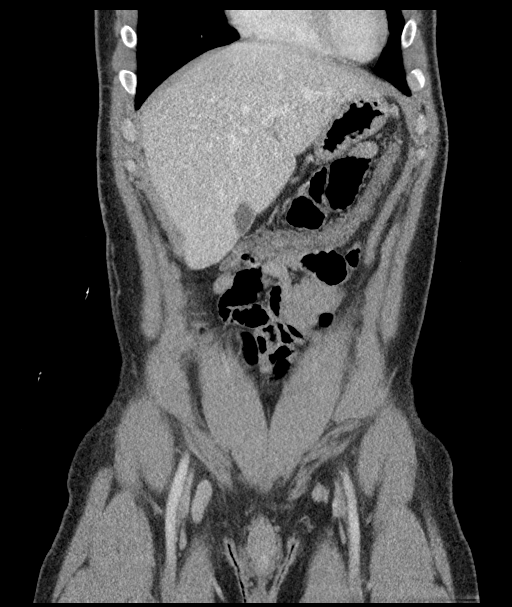
[im 67/151  soft-tissue]
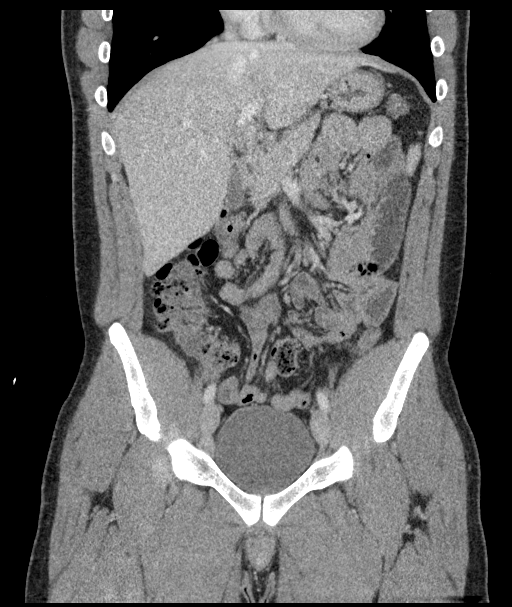
[im 84/151  soft-tissue]
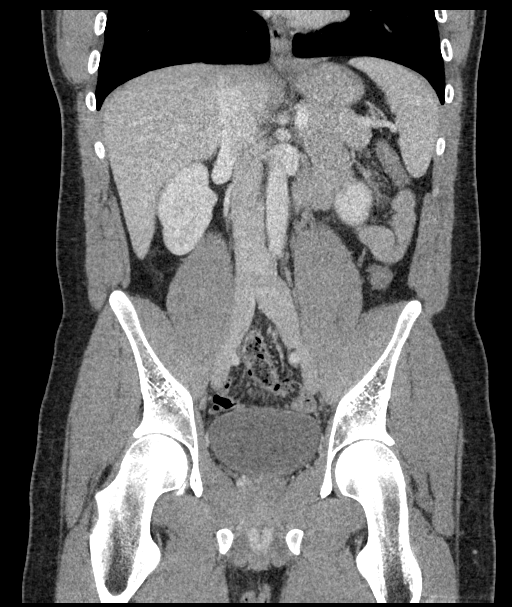

[16 of 46 positions shown; findings below may reference images not displayed]

FINDINGS: Lower chest: Unremarkable

Hepatobiliary: Unremarkable

Pancreas: Unremarkable

Spleen: Unremarkable

Adrenals/Urinary Tract: Unremarkable

Stomach/Bowel: Unremarkable

Vascular/Lymphatic: Right external iliac node 1.0 cm short axis,
image 68 series 3.

Reproductive: Unremarkable

Other: In the subcutaneous tissues of the right buttock medial to
the gluteal fold and along the gluteal cleft, a 3.1 by 1.4 by 2.5 cm
hypodense lesion is present suspicious for a small subcutaneous
abscess. There is adjacent stranding in the buttock not extending
substantially anteriorly along the perineum or scrotum.

Musculoskeletal: Unremarkable
IMPRESSION: 1. In the subcutaneous tissues of the right buttock medial to the
gluteal fold and along the gluteal cleft, a 3.1 by 1.4 by 2.5 cm
hypodense lesion is present suspicious for a small subcutaneous
abscess. There is adjacent stranding in the buttock not extending
substantially anteriorly along the perineum or scrotum. No abnormal
gas is identified tracking in the soft tissues or along regional
musculature.
2. Borderline enlarged right external iliac node, likely reactive.

## 2021-12-30 IMAGING — DX DG SHOULDER 2+V*R*
3 series · 3 of 3 positions shown · non-contrast
Comparison: None.

CLINICAL DATA: Right shoulder mass

EXAM:
RIGHT SHOULDER - 2+ VIEW

[shoulder grashey]
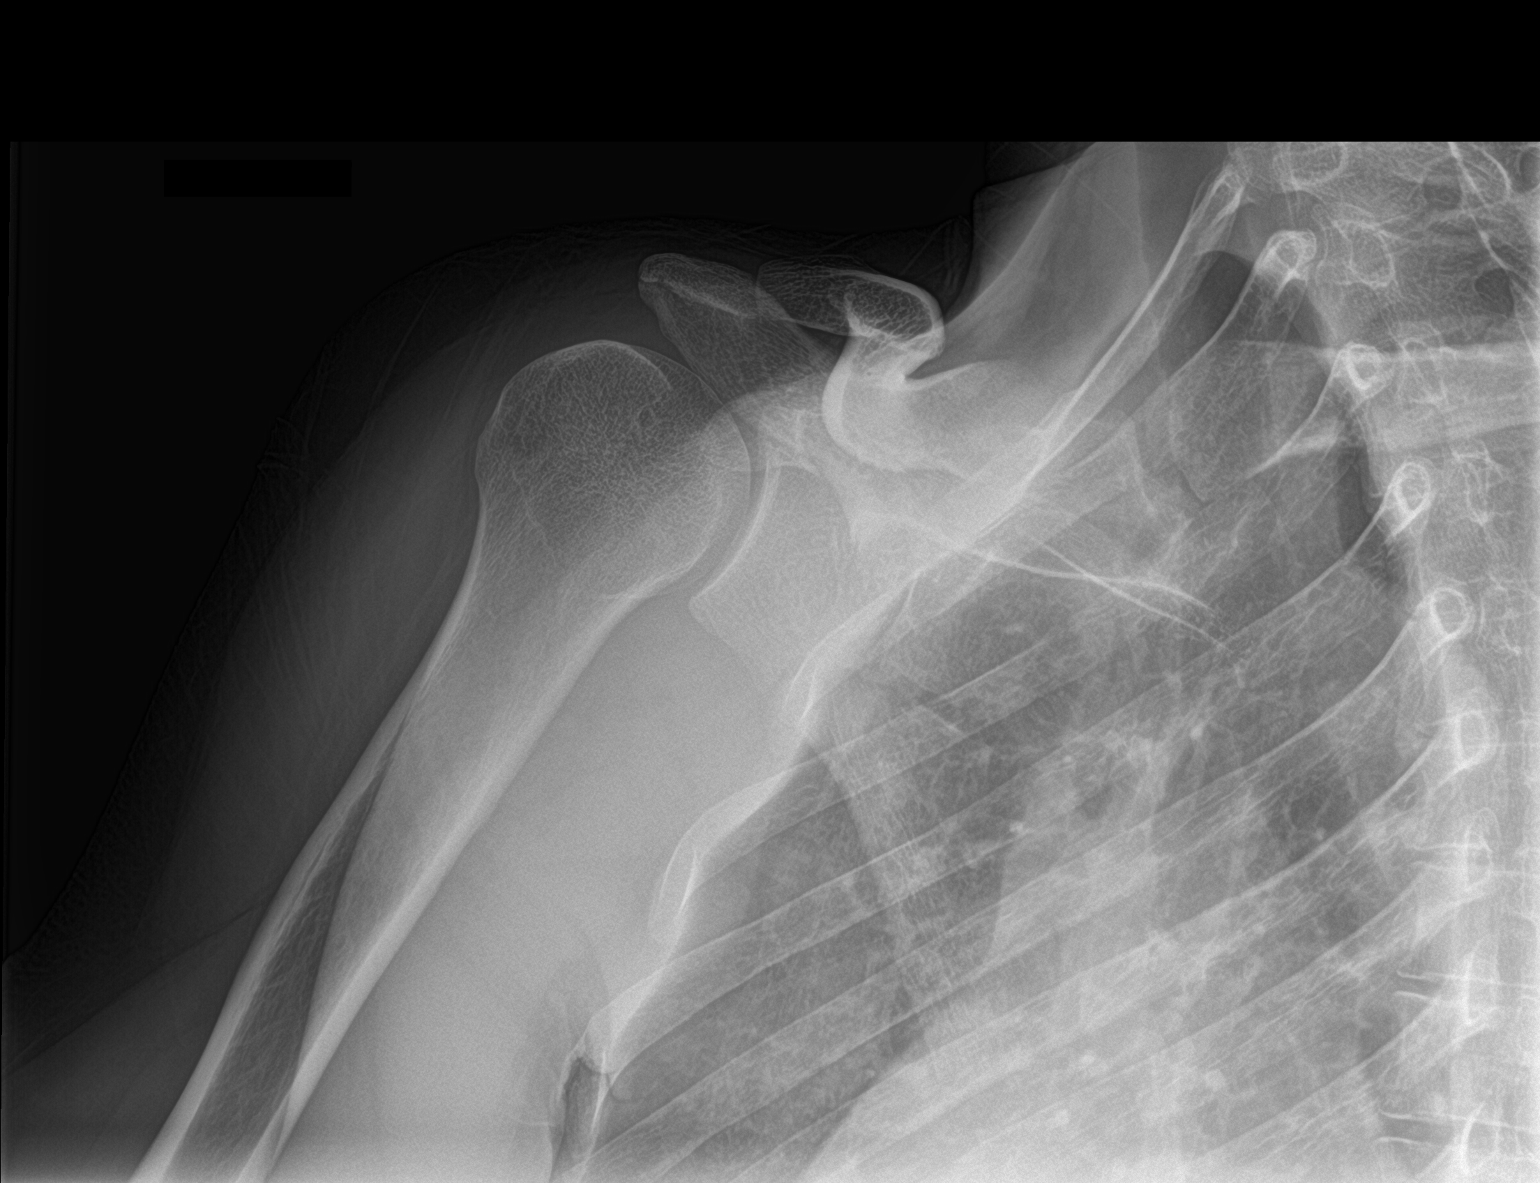

[shoulder y view]
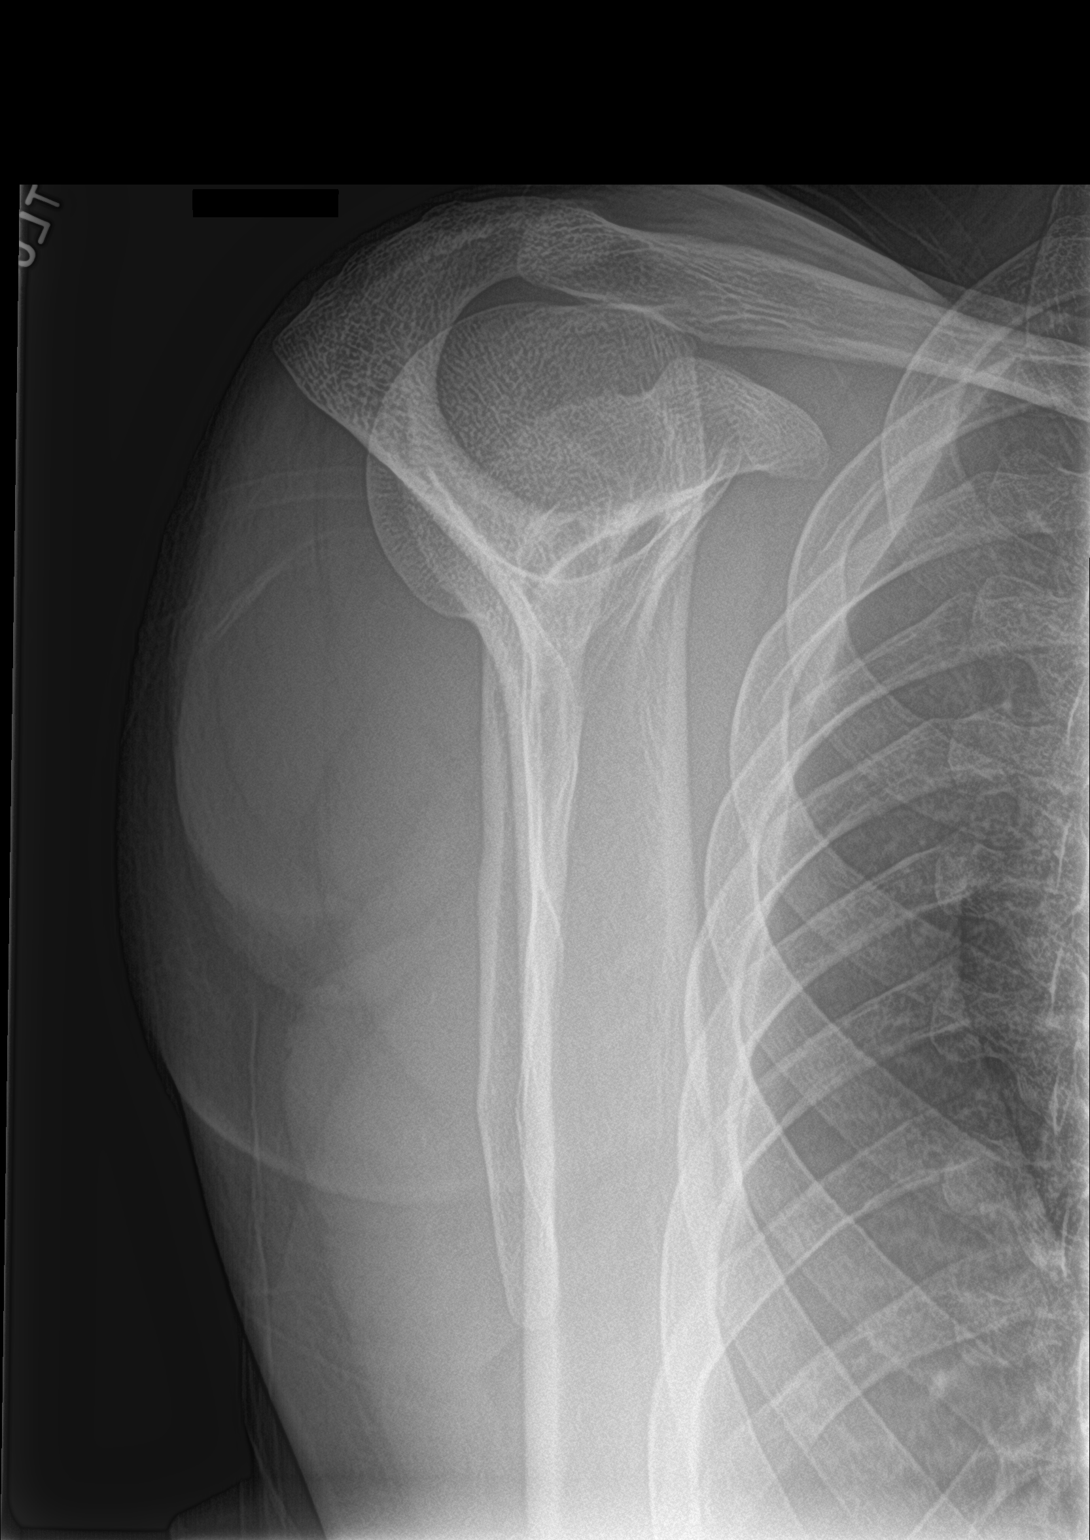

[shoulder axillary]
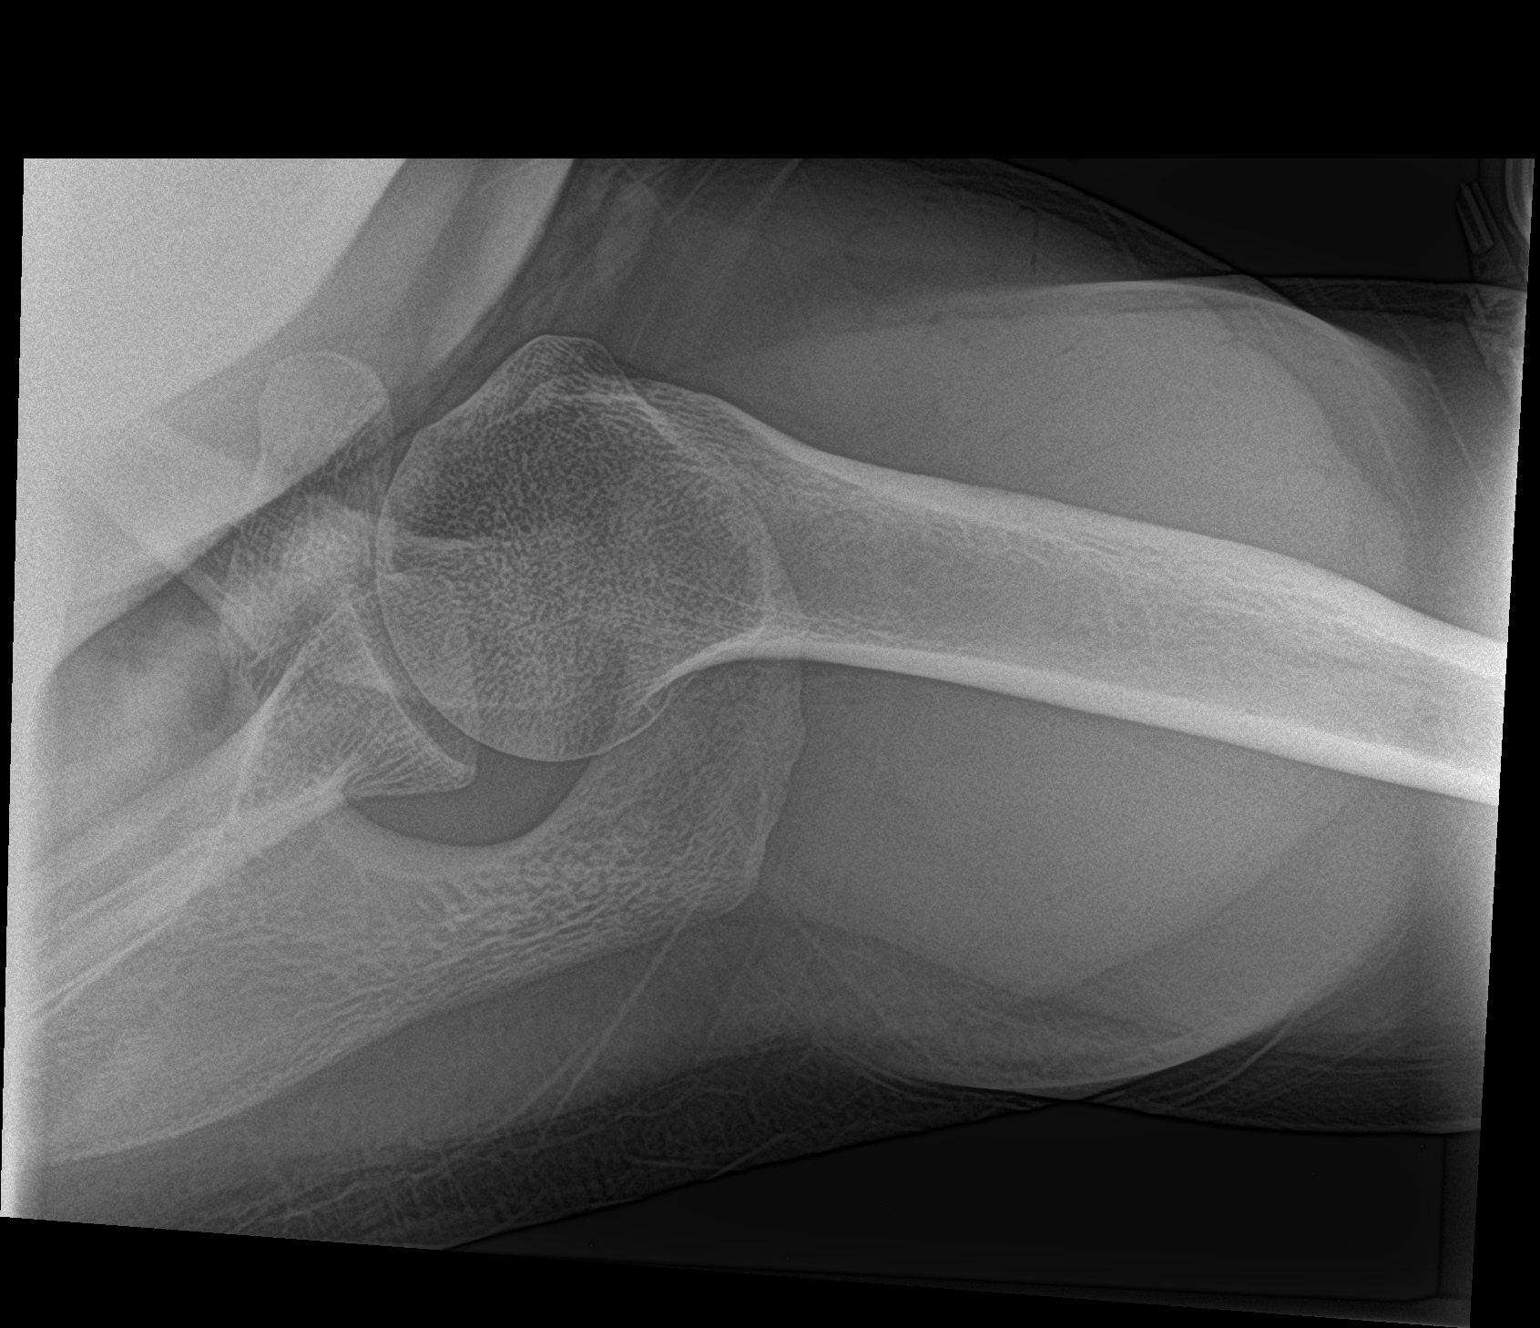

[3 of 3 positions shown; findings below may reference images not displayed]

FINDINGS: There is no evidence of fracture or dislocation. There is no
evidence of arthropathy or other focal bone abnormality. Soft
tissues are unremarkable.
IMPRESSION: Negative.

## 2022-11-11 ENCOUNTER — Emergency Department (HOSPITAL_COMMUNITY): Payer: Self-pay

## 2022-11-11 ENCOUNTER — Encounter (HOSPITAL_COMMUNITY): Payer: Self-pay

## 2022-11-11 ENCOUNTER — Emergency Department (HOSPITAL_COMMUNITY)
Admission: EM | Admit: 2022-11-11 | Discharge: 2022-11-11 | Disposition: A | Discharge status: Home / Self Care | Payer: Self-pay | Attending: Emergency Medicine | Admitting: Emergency Medicine

## 2022-11-11 ENCOUNTER — Other Ambulatory Visit: Payer: Self-pay

## 2022-11-11 DIAGNOSIS — L739 Follicular disorder, unspecified: Secondary | ICD-10-CM

## 2022-11-11 DIAGNOSIS — R591 Generalized enlarged lymph nodes: Secondary | ICD-10-CM | POA: Insufficient documentation

## 2022-11-11 DIAGNOSIS — L738 Other specified follicular disorders: Secondary | ICD-10-CM | POA: Insufficient documentation

## 2022-11-11 DIAGNOSIS — R2231 Localized swelling, mass and lump, right upper limb: Secondary | ICD-10-CM | POA: Insufficient documentation

## 2022-11-11 LAB — BASIC METABOLIC PANEL
Anion gap: 7 (ref 5–15)
BUN: 9 mg/dL (ref 6–20)
CO2: 24 mmol/L (ref 22–32)
Calcium: 9.1 mg/dL (ref 8.9–10.3)
Chloride: 105 mmol/L (ref 98–111)
Creatinine, Ser: 1 mg/dL (ref 0.61–1.24)
GFR, Estimated: >60 mL/min (ref >60)
Glucose, Bld: 91 mg/dL (ref 70–99)
Potassium: 4 mmol/L (ref 3.5–5.1)
Sodium: 136 mmol/L (ref 135–145)

## 2022-11-11 LAB — CBC WITH DIFFERENTIAL/PLATELET
Abs Immature Granulocytes: 0.02 10*3/uL (ref 0.00–0.07)
Basophils Absolute: 0 10*3/uL (ref 0.0–0.1)
Basophils Relative: 0 %
Eosinophils Absolute: 0.3 10*3/uL (ref 0.0–0.5)
Eosinophils Relative: 4 %
HCT: 40.3 % (ref 39.0–52.0)
Hemoglobin: 13.8 g/dL (ref 13.0–17.0)
Immature Granulocytes: 0 %
Lymphocytes Relative: 27 %
Lymphs Abs: 1.9 10*3/uL (ref 0.7–4.0)
MCH: 30.8 pg (ref 26.0–34.0)
MCHC: 34.2 g/dL (ref 30.0–36.0)
MCV: 90 fL (ref 80.0–100.0)
Monocytes Absolute: 0.5 10*3/uL (ref 0.1–1.0)
Monocytes Relative: 7 %
Neutro Abs: 4.4 10*3/uL (ref 1.7–7.7)
Neutrophils Relative %: 62 %
Platelets: 313 10*3/uL (ref 150–400)
RBC: 4.48 MIL/uL (ref 4.22–5.81)
RDW: 12.8 % (ref 11.5–15.5)
WBC: 7.1 10*3/uL (ref 4.0–10.5)
nRBC: 0 % (ref 0.0–0.2)

## 2022-11-11 LAB — I-STAT CHEM 8, ED
BUN: 11 mg/dL (ref 6–20)
Calcium, Ion: 1.16 mmol/L (ref 1.15–1.40)
Chloride: 104 mmol/L (ref 98–111)
Creatinine, Ser: 1 mg/dL (ref 0.61–1.24)
Glucose, Bld: 87 mg/dL (ref 70–99)
HCT: 42 % (ref 39.0–52.0)
Hemoglobin: 14.3 g/dL (ref 13.0–17.0)
Potassium: 4.1 mmol/L (ref 3.5–5.1)
Sodium: 139 mmol/L (ref 135–145)
TCO2: 24 mmol/L (ref 22–32)

## 2022-11-11 MED ORDER — IOHEXOL 350 MG/ML SOLN
75.0000 mL | Freq: Once | INTRAVENOUS | Status: AC | PRN
  Administered 2022-11-11: 75 mL via INTRAVENOUS

## 2022-11-11 MED ORDER — DOXYCYCLINE HYCLATE 100 MG PO CAPS: 100.0000 mg | ORAL_CAPSULE | Freq: Two times a day (BID) | ORAL | 0 refills | Status: AC

## 2022-11-11 NOTE — ED Provider Notes (Signed)
[facility] EMERGENCY DEPARTMENT Provider Note   CSN: [id number] Arrival date & time: 11/11/22  1121     History  Chief Complaint  Patient presents with   Facial Pain   Facial Swelling    Tony Brooks is a 26 y.o. male.  HPI Patient reports he had a swollen area on the side of his face that is been present for about a month.  Is close to the right jaw.  He reports it is somewhat tender but not severely painful.  He is also started to lose beard hair in the area.  He has not had any fevers.  No difficulty swallowing.  He reports he has had some issues previously with abscesses that he had gotten in the groin area but has never had any issues on the face.  Patient also mentioned a swollen area on his right shoulder.  He reports that is been there for a number of years actually.  He mentions it from time to time to providers.  He has not had follow-up for it.  He reports it feels like there is an extra pressure on top of the shoulder although is not really painful.  It has not changed in years.    Home Medications Prior to Admission medications   Medication Sig Start Date End Date Taking? Authorizing Provider  doxycycline (VIBRAMYCIN) 100 MG capsule Take 1 capsule (100 mg total) by mouth 2 (two) times daily. One po bid x 7 days 11/11/22  Yes Lyan Holck, Lebron Conners, MD  cyclobenzaprine (FLEXERIL) 10 MG tablet Take 1 tablet (10 mg total) by mouth 2 (two) times daily as needed for muscle spasms. 05/08/18   Jeannie Fend, PA-C  meloxicam (MOBIC) 7.5 MG tablet Take 1 tablet (7.5 mg total) by mouth daily. 05/08/18   Jeannie Fend, PA-C      Allergies    Patient has no known allergies.    Review of Systems   Review of Systems  Physical Exam Updated Vital Signs BP 115/65 (BP Location: Right Arm)   Pulse (!) 56   Temp 98.7 F (37.1 C) (Oral)   Resp 17   Ht 5\' 11"  (1.803 m)   Wt 88.5 kg   SpO2 100%   BMI 27.20 kg/m  Physical Exam Constitutional:      Comments:  Patient is alert.  Nontoxic, well in appearance.  No respiratory distress.  Well-nourished well-developed.  HENT:     Head:     Comments: Patient has a firm nodule at the angle of the mandible on the right.  Its approximately 2-1/2 to 3 cm.  Fairly smooth.  Adjacent to this there is a focal area of beard loss with some appearance of folliculitis.  No palpable abscess or fluctuance.  See attached images.    Right Ear: Tympanic membrane normal.     Left Ear: Tympanic membrane normal.     Nose: Nose normal.     Mouth/Throat:     Mouth: Mucous membranes are moist.     Pharynx: Oropharynx is clear.     Comments: Mucous membranes are pink and moist.  Dentition is in good condition.  No areas of abscesses in the floor of the mouth.  Palpation along gumline does not have any tenderness or fullness. Eyes:     Extraocular Movements: Extraocular movements intact.     Pupils: Pupils are equal, round, and reactive to light.  Neck:     Comments: Neck is supple.  No significant lymphadenopathy.  Patient does have this focal nodule as previously described.  No meningismus.  No trismus. Cardiovascular:     Rate and Rhythm: Normal rate and regular rhythm.  Pulmonary:     Effort: Pulmonary effort is normal.     Breath sounds: Normal breath sounds.  Musculoskeletal:        General: Normal range of motion.     Cervical back: Neck supple.     Comments: Patient has a soft fullness on the top of the right shoulder approximately 10 cm diameter.  This has smooth margins.  It is soft.  Suggestive of lipoma.  No erythema.  No joint effusion.  The remainder of the arm is normal.  Skin:    General: Skin is warm and dry.  Neurological:     General: No focal deficit present.     Mental Status: He is oriented to person, place, and time.     Coordination: Coordination normal.         ED Results / Procedures / Treatments   Labs (all labs ordered are listed, but only abnormal results are displayed) Labs  Reviewed  BASIC METABOLIC PANEL  CBC WITH DIFFERENTIAL/PLATELET  I-STAT CHEM 8, ED    EKG None  Radiology No results found.  Procedures Procedures    Medications Ordered in ED Medications  iohexol (OMNIPAQUE) 350 MG/ML injection 75 mL (75 mLs Intravenous Contrast Given 11/11/22 1502)    ED Course/ Medical Decision Making/ A&P                           Medical Decision Making Risk Prescription drug management.   Patient presents as outlined with a neck nodule.  Patient was evaluated in triage and CT scan done.  No significant abscess or tumor.  Reactive lymph nodes present.  On exam patient has a focal area of beard loss that appears consistent with folliculitis.  The oral cavity is widely patent.  There is no firmness to the floor the mouth or suggestion of Ludwig's angina.  At this time I suspect patient has lymphadenopathy from folliculitis.  No present abscess.  Plan will be to initiate antibiotics with doxycycline.  Patient did not present for his soft tissue mass on the right shoulder.  This appears stable and has been present for several years.  Most likely lipoma however I have encouraged the patient that this must have further follow-up to rule out more concerning diagnoses such as sarcoma.  This is nontender, does not involve the joint and is stable.        Final Clinical Impression(s) / ED Diagnoses Final diagnoses:  Folliculitis  Lymphadenopathy  Mass of skin of shoulder, right    Rx / DC Orders ED Discharge Orders          Ordered    doxycycline (VIBRAMYCIN) 100 MG capsule  2 times daily        11/11/22 1949              Arby Barrette, MD 11/17/22 2108

## 2022-11-11 NOTE — ED Triage Notes (Signed)
Pt reports for the past month or so he has been experiencing some pain and swelling to the right side of his face. Pt reports he has been losing facial hair around the area of concern. Pt is AxOx4. NAD.

## 2022-11-11 NOTE — ED Provider Triage Note (Signed)
Emergency Medicine Provider Triage Evaluation Note  Tony Brooks , a 26 y.o. male  was evaluated in triage.  Pt complains of pain and swelling to the right side of his face that has been going on for about a month.  He reports he has also been losing facial hair around the painful area and notices a hard lump under his jaw.  Denies fevers, dental pain, or drainage from the area.   Review of Systems  Positive: As above Negative: As above  Physical Exam  BP 122/72 (BP Location: Right Arm)   Pulse 68   Temp 98.3 F (36.8 C) (Oral)   Resp 17   SpO2 98%  Gen:   Awake, no distress   Resp:  Normal effort  MSK:   Moves extremities without difficulty  Other:  Enlarged, firm submandibular lymph node, TTP, circular patch of missing hair  Medical Decision Making  Medically screening exam initiated at 1:25 PM.  Appropriate orders placed.  Elyon Zoll was informed that the remainder of the evaluation will be completed by another provider, this initial triage assessment does not replace that evaluation, and the importance of remaining in the ED until their evaluation is complete.     Melton Alar R, [place] 11/11/22 1327

## 2023-05-26 ENCOUNTER — Other Ambulatory Visit: Payer: Self-pay | Admitting: Physician Assistant

## 2023-05-26 DIAGNOSIS — R2231 Localized swelling, mass and lump, right upper limb: Secondary | ICD-10-CM

## 2023-05-31 ENCOUNTER — Ambulatory Visit
Admission: RE | Admit: 2023-05-31 | Discharge: 2023-05-31 | Disposition: A | Discharge status: Home / Self Care | Payer: Commercial Managed Care - HMO | Source: Ambulatory Visit | Attending: Physician Assistant | Admitting: Physician Assistant

## 2023-05-31 DIAGNOSIS — R2231 Localized swelling, mass and lump, right upper limb: Secondary | ICD-10-CM

## 2023-09-08 ENCOUNTER — Ambulatory Visit: Payer: Self-pay | Admitting: Family Medicine
